# Patient Record
Sex: Male | Born: 1990 | Race: White | Hispanic: No | Marital: Single | Smoking: Current every day smoker
Health system: Southern US, Community
[De-identification: ages and names within clinical notes are randomized; demographics above are authoritative.]

## PROBLEM LIST (undated history)

## (undated) DIAGNOSIS — F1011 Alcohol abuse, in remission: Secondary | ICD-10-CM

## (undated) HISTORY — PX: HERNIA REPAIR: SHX51

---

## 2016-01-22 ENCOUNTER — Emergency Department (HOSPITAL_COMMUNITY): Payer: BLUE CROSS/BLUE SHIELD

## 2016-01-22 ENCOUNTER — Encounter (HOSPITAL_COMMUNITY): Payer: Self-pay

## 2016-01-22 ENCOUNTER — Emergency Department (HOSPITAL_COMMUNITY)
Admission: EM | Admit: 2016-01-22 | Discharge: 2016-01-22 | Disposition: A | Discharge status: Home / Self Care | Payer: BLUE CROSS/BLUE SHIELD | Attending: Emergency Medicine | Admitting: Emergency Medicine

## 2016-01-22 DIAGNOSIS — R531 Weakness: Secondary | ICD-10-CM | POA: Diagnosis present

## 2016-01-22 DIAGNOSIS — R2990 Unspecified symptoms and signs involving the nervous system: Secondary | ICD-10-CM | POA: Diagnosis not present

## 2016-01-22 DIAGNOSIS — R791 Abnormal coagulation profile: Secondary | ICD-10-CM | POA: Diagnosis not present

## 2016-01-22 DIAGNOSIS — F1010 Alcohol abuse, uncomplicated: Secondary | ICD-10-CM

## 2016-01-22 DIAGNOSIS — I639 Cerebral infarction, unspecified: Secondary | ICD-10-CM

## 2016-01-22 DIAGNOSIS — R200 Anesthesia of skin: Secondary | ICD-10-CM

## 2016-01-22 DIAGNOSIS — F1020 Alcohol dependence, uncomplicated: Secondary | ICD-10-CM | POA: Insufficient documentation

## 2016-01-22 DIAGNOSIS — R29898 Other symptoms and signs involving the musculoskeletal system: Secondary | ICD-10-CM

## 2016-01-22 HISTORY — DX: Alcohol abuse, in remission: F10.11

## 2016-01-22 LAB — URINALYSIS, ROUTINE W REFLEX MICROSCOPIC
BILIRUBIN URINE: NEGATIVE
Glucose, UA: NEGATIVE mg/dL
Hgb urine dipstick: NEGATIVE
KETONES UR: NEGATIVE mg/dL
LEUKOCYTES UA: NEGATIVE
NITRITE: NEGATIVE
PH: 7 (ref 5.0–8.0)
PROTEIN: NEGATIVE mg/dL
Specific Gravity, Urine: 1.025 (ref 1.005–1.030)

## 2016-01-22 LAB — I-STAT CHEM 8, ED
BUN: 4 mg/dL — AB (ref 6–20)
CALCIUM ION: 1.1 mmol/L — AB (ref 1.12–1.23)
CHLORIDE: 105 mmol/L (ref 101–111)
CREATININE: 0.7 mg/dL (ref 0.61–1.24)
Glucose, Bld: 86 mg/dL (ref 65–99)
HCT: 46 % (ref 39.0–52.0)
Hemoglobin: 15.6 g/dL (ref 13.0–17.0)
Potassium: 3.4 mmol/L — ABNORMAL LOW (ref 3.5–5.1)
Sodium: 140 mmol/L (ref 135–145)
TCO2: 21 mmol/L (ref 0–100)

## 2016-01-22 LAB — COMPREHENSIVE METABOLIC PANEL
ALBUMIN: 4.8 g/dL (ref 3.5–5.0)
ALK PHOS: 57 U/L (ref 38–126)
ALT: 51 U/L (ref 17–63)
ANION GAP: 10 (ref 5–15)
AST: 49 U/L — AB (ref 15–41)
BILIRUBIN TOTAL: 0.6 mg/dL (ref 0.3–1.2)
BUN: 7 mg/dL (ref 6–20)
CALCIUM: 9.3 mg/dL (ref 8.9–10.3)
CO2: 21 mmol/L — AB (ref 22–32)
CREATININE: 0.71 mg/dL (ref 0.61–1.24)
Chloride: 106 mmol/L (ref 101–111)
GFR calc Af Amer: >60 mL/min (ref >60)
GFR calc non Af Amer: >60 mL/min (ref >60)
GLUCOSE: 89 mg/dL (ref 65–99)
Potassium: 3.3 mmol/L — ABNORMAL LOW (ref 3.5–5.1)
SODIUM: 137 mmol/L (ref 135–145)
TOTAL PROTEIN: 7.7 g/dL (ref 6.5–8.1)

## 2016-01-22 LAB — RAPID URINE DRUG SCREEN, HOSP PERFORMED
AMPHETAMINES: NOT DETECTED
BARBITURATES: NOT DETECTED
Benzodiazepines: NOT DETECTED
Cocaine: NOT DETECTED
Opiates: NOT DETECTED
TETRAHYDROCANNABINOL: NOT DETECTED

## 2016-01-22 LAB — CBC
HCT: 44.1 % (ref 39.0–52.0)
HEMOGLOBIN: 15.8 g/dL (ref 13.0–17.0)
MCH: 34.9 pg — AB (ref 26.0–34.0)
MCHC: 35.8 g/dL (ref 30.0–36.0)
MCV: 97.4 fL (ref 78.0–100.0)
Platelets: 254 10*3/uL (ref 150–400)
RBC: 4.53 MIL/uL (ref 4.22–5.81)
RDW: 12 % (ref 11.5–15.5)
WBC: 5.3 10*3/uL (ref 4.0–10.5)

## 2016-01-22 LAB — DIFFERENTIAL
Basophils Absolute: 0 10*3/uL (ref 0.0–0.1)
Basophils Relative: 0 %
EOS PCT: 2 %
Eosinophils Absolute: 0.1 10*3/uL (ref 0.0–0.7)
LYMPHS ABS: 1.9 10*3/uL (ref 0.7–4.0)
LYMPHS PCT: 36 %
Monocytes Absolute: 0.6 10*3/uL (ref 0.1–1.0)
Monocytes Relative: 11 %
NEUTROS ABS: 2.7 10*3/uL (ref 1.7–7.7)
NEUTROS PCT: 51 %

## 2016-01-22 LAB — PROTIME-INR
INR: 1.11 (ref 0.00–1.49)
Prothrombin Time: 14.1 seconds (ref 11.6–15.2)

## 2016-01-22 LAB — I-STAT TROPONIN, ED: Troponin i, poc: 0 ng/mL (ref 0.00–0.08)

## 2016-01-22 LAB — APTT: aPTT: 29 seconds (ref 24–37)

## 2016-01-22 LAB — ETHANOL: Alcohol, Ethyl (B): 25 mg/dL — ABNORMAL HIGH (ref <5)

## 2016-01-22 MED ORDER — IOPAMIDOL (ISOVUE-370) INJECTION 76%
100.0000 mL | Freq: Once | INTRAVENOUS | Status: AC | PRN
  Administered 2016-01-22: 100 mL via INTRAVENOUS

## 2016-01-22 MED ORDER — ASPIRIN EC 325 MG PO TBEC
325.0000 mg | DELAYED_RELEASE_TABLET | Freq: Every day | ORAL | Status: DC
  Administered 2016-01-22: 325 mg via ORAL
  Filled 2016-01-22: qty 1

## 2016-01-22 MED ORDER — GADOBENATE DIMEGLUMINE 529 MG/ML IV SOLN
20.0000 mL | Freq: Once | INTRAVENOUS | Status: AC | PRN
  Administered 2016-01-22: 16 mL via INTRAVENOUS

## 2016-01-22 MED ORDER — CHLORDIAZEPOXIDE HCL 25 MG PO CAPS: ORAL_CAPSULE | ORAL | Status: DC

## 2016-01-22 MED ORDER — SODIUM CHLORIDE 0.9 % IV BOLUS (SEPSIS)
500.0000 mL | Freq: Once | INTRAVENOUS | Status: AC
  Administered 2016-01-22: 500 mL via INTRAVENOUS

## 2016-01-22 NOTE — ED Notes (Addendum)
Patient transported to MRI. RN WITH PT. CHARGE ALAINA RN AWARE

## 2016-01-22 NOTE — Consult Note (Signed)
Requesting Physician: Dr. Cyndie Chime     Reason for consultation:  To evaluate for acute stroke  HPI:                                                                                                                                         Timothy Holland is an 25 y.o. male patient with ongoing alcohol abuse history, chronic migraines, workup this morning with symptoms of left face and left arm numbness and paresthesias. He presented to the emergency room for further evaluation. His mother was at bedside. Patient apparently lives alone continues to drink alcohol daily. His mother reports that his alcohol abuse is mainly due to insomnia.he denies any other new vision or speech problems or any motor weakness in limbs .   Past Medical History: Past Medical History  Diagnosis Date  . History of ETOH abuse     Past Surgical History  Procedure Laterality Date  . Hernia repair      Family History: No family history on file.  Social History:   reports that he has been smoking.  He does not have any smokeless tobacco history on file. He reports that he drinks alcohol. His drug history is not on file.  Allergies:  No Known Allergies   Medications:                                                                                                                         Current facility-administered medications:  .  aspirin EC tablet 325 mg, 325 mg, Oral, Daily, Andreah Goheen Daniel Nones, MD No current outpatient prescriptions on file.   ROS:  History obtained from the patient  General ROS: negative for - chills, fatigue, fever, night sweats, weight gain or weight loss Psychological ROS: negative for - behavioral disorder, hallucinations, memory difficulties, mood swings or suicidal ideation Ophthalmic ROS: negative for - blurry vision, double vision, eye pain or  loss of vision ENT ROS: negative for - epistaxis, nasal discharge, oral lesions, sore throat, tinnitus or vertigo Allergy and Immunology ROS: negative for - hives or itchy/watery eyes Hematological and Lymphatic ROS: negative for - bleeding problems, bruising or swollen lymph nodes Endocrine ROS: negative for - galactorrhea, hair pattern changes, polydipsia/polyuria or temperature intolerance Respiratory ROS: negative for - cough, hemoptysis, shortness of breath or wheezing Cardiovascular ROS: negative for - chest pain, dyspnea on exertion, edema or irregular heartbeat Gastrointestinal ROS: negative for - abdominal pain, diarrhea, hematemesis, nausea/vomiting or stool incontinence Genito-Urinary ROS: negative for - dysuria, hematuria, incontinence or urinary frequency/urgency Musculoskeletal ROS: negative for - joint swelling or muscular weakness Neurological ROS: as noted in HPI Dermatological ROS: negative for rash and skin lesion changes  Neurologic Examination:                                                                                                    Today's Vitals   01/22/16 1004 01/22/16 1010 01/22/16 1011 01/22/16 1100  BP:  141/76  132/76  Pulse:  76  74  Temp:  98.3 F (36.8 C)    TempSrc:  Oral    Resp:  18  13  Height:  5\' 10"  (1.778 m)    Weight:  79.833 kg (176 lb)    SpO2: 100% 100%  100%  PainSc:   0-No pain     Evaluation of higher integrative functions including: Level of alertness: Alert,  Oriented to time, place and person Recent and remote memory - intact   Attention span and concentration  - intact   Speech: fluent, no evidence of dysarthria or aphasia noted.  Test the following cranial nerves: 2-12 grossly intact Motor examination: Normal tone, bulk, full 5/5 motor strength in all 4 extremities Examination of sensation : subjective reduced sensation in the left face and left arm , otherwise normal elsewhere  Examination of deep tendon reflexes: 2+,  normal and symmetric in all extremities, normal plantars bilaterally Test coordination: Normal finger nose testing, with no evidence of limb appendicular ataxia or abnormal involuntary movements or tremors noted.  Gait: normal      Lab Results: Basic Metabolic Panel:  Recent Labs Lab 01/22/16 1022 01/22/16 1031  NA 137 140  K 3.3* 3.4*  CL 106 105  CO2 21*  --   GLUCOSE 89 86  BUN 7 4*  CREATININE 0.71 0.70  CALCIUM 9.3  --     Liver Function Tests:  Recent Labs Lab 01/22/16 1022  AST 49*  ALT 51  ALKPHOS 57  BILITOT 0.6  PROT 7.7  ALBUMIN 4.8   No results for input(s): LIPASE, AMYLASE in the last 168 hours. No results for input(s): AMMONIA in the last 168 hours.  CBC:  Recent Labs Lab 01/22/16 1022 01/22/16  1031  WBC 5.3  --   NEUTROABS 2.7  --   HGB 15.8 15.6  HCT 44.1 46.0  MCV 97.4  --   PLT 254  --     Cardiac Enzymes: No results for input(s): CKTOTAL, CKMB, CKMBINDEX, TROPONINI in the last 168 hours.  Lipid Panel: No results for input(s): CHOL, TRIG, HDL, CHOLHDL, VLDL, LDLCALC in the last 168 hours.  CBG: No results for input(s): GLUCAP in the last 168 hours.  Microbiology: No results found for this or any previous visit.   Imaging: No results found.      Assessment and plan:   Timothy Holland is an 25 y.o. male patient with ongoing alcohol abuse history, chronic migraines, workup this morning with symptoms of left face and left arm numbness and paresthesias. He presented to the emergency room for further evaluation. His mother was at bedside. Patient apparently lives alone continues to drink alcohol daily. His mother reports that his alcohol abuse is mainly due to insomnia.he denies any other new vision or speech problems or any motor weakness in limbs . Grossly nonfocal exam except for some subjective reduced sensation in the left face and left hand.  At this point, I recommend MRI of brain And MRI of the cervical spineto rule out  acute pathology contributing to these symptoms.  Both of these studies were done in the ER, and were negative for any pathology. Reviewed the images with both patient and his mother, reassured them. No further neurodiagnostic testing or treatment recommended at this point. Advised to follow-up with outpatient neurology if symptoms persist. Counseled about alcohol cessation.   Discussed with ER physician.

## 2016-01-22 NOTE — Progress Notes (Signed)
Patient listed as having [employer] insurance without a pcp.  EDCM spoke to patient at bedside.  Patient confirms he does not have a pcp.  Patient's mother at bedside.  Patient's [place].  EDCM offered list of pcps in [place], but patient refused.  He reports, "I don't really like going to doctors anyway."  EDCM explained the proper use of the ED vs pcp.  EDCM  Discussed importance and purpose of pcp.  [facility] as well.  Patient's mother asked, "Does he need to see a neurologist or just if this happens again?"  EDCM deferred question to EDRN who will speak to patient and family.  No further EDCM needs at this time.

## 2016-01-22 NOTE — ED Notes (Signed)
Neuro present

## 2016-01-22 NOTE — Discharge Instructions (Signed)
You were seen and evaluated today for your left arm weakness. The exact cause is not clear at this time. Please follow-up with neurology outpatient. You have been provided with a prescription of medication to help you detox from alcohol. You've also been given resources that he should follow-up with in the community to help with this issue. Addiction is a strong disease and this will require lots of help and support.  Alcohol Abuse and Nutrition Alcohol abuse is any pattern of alcohol consumption that harms your health, relationships, or work. Alcohol abuse can affect how your body breaks down and absorbs nutrients from food by causing your liver to work abnormally. Additionally, many people who abuse alcohol do not eat enough carbohydrates, protein, fat, vitamins, and minerals. This can cause poor nutrition (malnutrition) and a lack of nutrients (nutrient deficiencies), which can lead to further complications. Nutrients that are commonly lacking (deficient) among people who abuse alcohol include:  Vitamins.  Vitamin A. This is stored in your liver. It is important for your vision, metabolism, and ability to fight off infections (immunity).  B vitamins. These include vitamins such as folate, thiamin, and niacin. These are important in new cell growth and maintenance.  Vitamin C. This plays an important role in iron absorption, wound healing, and immunity.  Vitamin D. This is produced by your liver, but you can also get vitamin D from food. Vitamin D is necessary for your body to absorb and use calcium.  Minerals.  Calcium. This is important for your bones and your heart and blood vessel (cardiovascular) function.  Iron. This is important for blood, muscle, and nervous system functioning.  Magnesium. This plays an important role in muscle and nerve function, and it helps to control blood sugar and blood pressure.  Zinc. This is important for the normal function of your nervous system and  digestive system (gastrointestinal tract). Nutrition is an essential component of therapy for alcohol abuse. Your health care provider or dietitian will work with you to design a plan that can help restore nutrients to your body and prevent potential complications. WHAT IS MY PLAN? Your dietitian may develop a specific diet plan that is based on your condition and any other complications you may have. A diet plan will commonly include:  A balanced diet.  Grains: 6-8 oz per day.  Vegetables: 2-3 cups per day.  Fruits: 1-2 cups per day.  Meat and other protein: 5-6 oz per day.  Dairy: 2-3 cups per day.  Vitamin and mineral supplements. WHAT DO I NEED TO KNOW ABOUT ALCOHOL AND NUTRITION?  Consume foods that are high in antioxidants, such as grapes, berries, nuts, green tea, and dark green and orange vegetables. This can help to counteract some of the stress that is placed on your liver by consuming alcohol.  Avoid food and drinks that are high in fat and sugar. Foods such as sugared soft drinks, salty snack foods, and candy contain empty calories. This means that they lack important nutrients such as protein, fiber, and vitamins.  Eat frequent meals and snacks. Try to eat 5-6 small meals each day.  Eat a variety of fresh fruits and vegetables each day. This will help you get plenty of water, fiber, and vitamins in your diet.  Drink plenty of water and other clear fluids. Try to drink at least 48-64 oz (1.5-2 L) of water per day.  If you are a vegetarian, eat a variety of protein-rich foods. Pair whole grains with plant-based proteins at meals  and snacks to obtain the greatest nutrient benefit from your food. For example, eat rice with beans, put peanut butter on whole-grain toast, or eat oatmeal with sunflower seeds.  Soak beans and whole grains overnight before cooking. This can help your body to absorb the nutrients more easily.  Include foods fortified with vitamins and minerals in  your diet. Commonly fortified foods include milk, orange juice, cereal, and bread.  If you are malnourished, your dietitian may recommend a high-protein, high-calorie diet. This may include:  2,000-3,000 calories (kilocalories) per day.  70-100 grams of protein per day.  Your health care provider may recommend a complete nutritional supplement beverage. This can help to restore calories, protein, and vitamins to your body. Depending on your condition, you may be advised to consume this instead of or in addition to meals.  Limit your intake of caffeine. Replace drinks like coffee and black tea with decaffeinated coffee and herbal tea.  Eat a variety of foods that are high in omega fatty acids. These include fish, nuts and seeds, and soybeans. These foods may help your liver to recover and may also stabilize your mood.  Certain medicines may cause changes in your appetite, taste, and weight. Work with your health care provider and dietitian to make any adjustments to your medicines and diet plan.  Include other healthy lifestyle choices in your daily routine.  Be physically active.  Get enough sleep.  Spend time doing activities that you enjoy.  If you are unable to take in enough food and calories by mouth, your health care provider may recommend a feeding tube. This is a tube that passes through your nose and throat, directly into your stomach. Nutritional supplement beverages can be given to you through the feeding tube to help you get the nutrients you need.  Take vitamin or mineral supplements as recommended by your health care provider. WHAT FOODS CAN I EAT? Grains Enriched pasta. Enriched rice. Fortified whole-grain bread. Fortified whole-grain cereal. Barley. Brown rice. Quinoa. Millet. Vegetables All fresh, frozen, and canned vegetables. Spinach. Kale. Artichoke. Carrots. Winter squash and pumpkin. Sweet potatoes. Broccoli. Cabbage. Cucumbers. Tomatoes. Sweet peppers. Green  beans. Peas. Corn. Fruits All fresh and frozen fruits. Berries. Grapes. Mango. Papaya. Guava. Cherries. Apples. Bananas. Peaches. Plums. Pineapple. Watermelon. Cantaloupe. Oranges. Avocado. Meats and Other Protein Sources Beef liver. Lean beef. Pork. Fresh and canned chicken. Fresh fish. Oysters. Sardines. Canned tuna. Shrimp. Eggs with yolks. Nuts and seeds. Peanut butter. Beans and lentils. Soybeans. Tofu. Dairy Whole, low-fat, and nonfat milk. Whole, low-fat, and nonfat yogurt. Cottage cheese. Sour cream. Hard and soft cheeses. Beverages Water. Herbal tea. Decaffeinated coffee. Decaffeinated green tea. 100% fruit juice. 100% vegetable juice. Instant breakfast shakes. Condiments Ketchup. Mayonnaise. Mustard. Salad dressing. Barbecue sauce. Sweets and Desserts Sugar-free ice cream. Sugar-free pudding. Sugar-free gelatin. Fats and Oils Butter. Vegetable oil, flaxseed oil, olive oil, and walnut oil. Other Complete nutrition shakes. Protein bars. Sugar-free gum. The items listed above may not be a complete list of recommended foods or beverages. Contact your dietitian for more options. WHAT FOODS ARE NOT RECOMMENDED? Grains Sugar-sweetened breakfast cereals. Flavored instant oatmeal. Fried breads. Vegetables Breaded or deep-fried vegetables. Fruits Dried fruit with added sugar. Candied fruit. Canned fruit in syrup. Meats and Other Protein Sources Breaded or deep-fried meats. Dairy Flavored milks. Fried cheese curds or fried cheese sticks. Beverages Alcohol. Sugar-sweetened soft drinks. Sugar-sweetened tea. Caffeinated coffee and tea. Condiments Sugar. Honey. Agave nectar. Molasses. Sweets and Desserts Chocolate. Cake. Cookies. Candy. Other Potato  chips. Pretzels. Salted nuts. Candied nuts. The items listed above may not be a complete list of foods and beverages to avoid. Contact your dietitian for more information.   This information is not intended to replace advice given to you  by your health care provider. Make sure you discuss any questions you have with your health care provider.   Document Released: 06/03/2005 Document Revised: 08/30/2014 Document Reviewed: 03/12/2014 Elsevier Interactive Patient Education 2016 Elsevier Inc.   Paresthesia Paresthesia is an abnormal burning or prickling sensation. This sensation is generally felt in the hands, arms, legs, or feet. However, it may occur in any part of the body. Usually, it is not painful. The feeling may be described as:  Tingling or numbness.  Pins and needles.  Skin crawling.  Buzzing.  Limbs falling asleep.  Itching. Most people experience temporary (transient) paresthesia at some time in their lives. Paresthesia may occur when you breathe too quickly (hyperventilation). It can also occur without any apparent cause. Commonly, paresthesia occurs when pressure is placed on a nerve. The sensation quickly goes away after the pressure is removed. For some people, however, paresthesia is a long-lasting (chronic) condition that is caused by an underlying disorder. If you continue to have paresthesia, you may need further medical evaluation. HOME CARE INSTRUCTIONS Watch your condition for any changes. Taking the following actions may help to lessen any discomfort that you are feeling:  Avoid drinking alcohol.  Try acupuncture or massage to help relieve your symptoms.  Keep all follow-up visits as directed by your health care provider. This is important. SEEK MEDICAL CARE IF:  You continue to have episodes of paresthesia.  Your burning or prickling feeling gets worse when you walk.  You have pain, cramps, or dizziness.  You develop a rash. SEEK IMMEDIATE MEDICAL CARE IF:  You feel weak.  You have trouble walking or moving.  You have problems with speech, understanding, or vision.  You feel confused.  You cannot control your bladder or bowel movements.  You have numbness after an injury.  You  faint.   This information is not intended to replace advice given to you by your health care provider. Make sure you discuss any questions you have with your health care provider.   Document Released: 07/30/2002 Document Revised: 12/24/2014 Document Reviewed: 08/05/2014 Elsevier Interactive Patient Education 2016 [employer].  [employer] Guide Outpatient Counseling/Substance Abuse Adolescent The [employer] 211 is a great source of information about community services available.  Access by dialing 2-1-1 from anywhere in [place], or by website -  PooledIncome.pl.   Other Local Resources (Updated 08/2015)  Crisis Hotlines   Services     Area Served  [employer]  Crisis Hotline, available 24 hours a day, 7 days a week: 636-146-6928 [facility], [place]   [facility]  Crisis Hotline, available 24 hours a day, 7 days a week: 214-601-2853 [facility], [place]  [facility]  Suicide Prevention Hotline, available 24 hours a day, 7 days a week: [phone] [facility], [place]  [employer], available 24 hours a day, 7 days a week: 902-023-6431 [facility], [place]   [facility] Access to [employer], available 24 hours a day, 7 days a week: 6474398986 All   [facility]  Crisis Hotline, available 24 hours a day, 7 days a week: (563) 600-3820 All   Other Local Resources (Updated 08/2015)  Outpatient Counseling/ Substance Abuse Programs  Services  Address and Phone Number  [facility]  Offers individual counseling [phone] [address], [address] [place], [place] [address]  [facility]  Offers individual counseling  Accepts Medicare, private pay, and private insurance [phone] [address], [address] [place], [place] [address]  [facility]  Provides individual counseling, substance abuse intensive  outpatient program (several hours a day, several days a week), day treatment program, and school-based therapy  Delene Lollccepts Medicare, Medicaid, private insurance [phone] [address], [address] [place], [place] [address]  [facility]  Offers individual counseling, family counseling, group therapy, substance abuse intensive outpatient program (several hours a day, several days a week), and a partial hospitalization program [phone] [address] [place], [place] [address]  [phone] [address] [place], [place] [address]  [phone] [address] [place], [place] [address]  [phone] [address] [place] [address] [place], [place] [address]  [facility]  Offers individual and family counseling  Accepts Medicaid and private insurance  Offers a sliding scale for uninsured [phone] [address], [address] [place], [place] [address]  [facility]  Accepts private insurance [phone] [address], [address] [place], [place] [address]  [facility] [employer]  Offers individual counseling and intensive in-home services [phone] [address], [address] [place], [place] [address]  [facility]  Offers individual counseling, family counseling, group therapy, domestic violence counseling  Accepts Medicaid and private insurance  Offers sliding scale for uninsured [phone] [address] [place], [place] [address]  [phone] [facility], [address] [place], [place] [address]  [facility]  Offers individual counseling, family counseling group therapy, and school-based therapy  3 locations - [place], [place], and [place] [phone]  [address] [place], [place] [address]  [address] [place], [place] [address]  [address] [place], [place] [address]  [facility]  Offers individual and family  counseling  Accepts [place] and private insurance  Offers sliding scale for uninsured [phone] [address] [place], [place] [address]  Len Blalockavid Fuller, MD  Accepts private insurance [phone] [address] [place], [place] [address]  [facility]   Offers outpatient substance abuse counseling, intensive outpatient substance abuse programs (several hours a day, several days a week), and residential substance abuse treatment  [phone], or [phone] [address], [address] [place], [place]  [facility]  Accepts private insurance [phone] [address] [place], [place] [address]  [facility]  Accepts Medicare and private insurance [phone] [address] [place], [place] [address]  [facility]    Offers intensive outpatient program (several hours a day, several days a week)  Accepts private pay and private insurance [phone] [facility] [address] [place], [place]  [facility]    Offers intensive outpatient program (several hours a day, several days a week), and partial hospitalization program [phone] [address] [place], [place] [address]  [facility]  Christian counseling  Offers individual and family counseling  Accepts private insurance  Offers sliding scale for uninsured [phone] [address] [place], [place] [address]  [facility] [phone] [address], [address] [place], [place] [address]  [facility]  Offers individual and family counseling  Offers LGBTQ services  Accepts private insurance and private pay [phone] [address] [place], [place] [address]  [facility]  Offers individual and family counseling  Accepts private insurance [phone] [address] [place], [place] [address]  [facility]   Adolescent  Substance Abuse Program (ASAP): [phone]  [employer] Structured Day Program: [phone] [place], [place]  [facility]  Serves children ages 54 - 63 and their families  Offers intensive in-home treatment and residential programs [phone] [address], [address] [place], [place] [address]

## 2016-01-22 NOTE — ED Notes (Signed)
CODE STROKE CALLED

## 2016-01-22 NOTE — Progress Notes (Signed)
CSW was notified by EDP that pt is interested in resources regarding alcohol treatment.   CSW met with patient at bedside. Patient was alert and oriented. Family was present. Patient confirms that he is interested in information for substance abuse. Patient states that he is interested in outpatient centers. CSW provided pt with a list of treatment centers. Patient accepted. Patient states that he has no questions at this time.  Willette Brace [phone] ED CSW 01/22/2016 4:17 PM

## 2016-01-22 NOTE — ED Notes (Signed)
MD at bedside. 

## 2016-01-22 NOTE — ED Provider Notes (Signed)
CSN: [id number]     Arrival date & time 01/22/16  7829 History   First MD Initiated Contact with Patient 01/22/16 1001     Chief Complaint  Patient presents with  . Extremity Weakness     (Consider location/radiation/quality/duration/timing/severity/associated sxs/prior Treatment) HPI Comments: 25 year old male with history of migraines presents for left arm weakness and left face numbness. The patient states that over the last few weeks he has had 2-3 migraines a week. He does not follow with a specialist for this because he drinks alcohol heavily and says he knows he can't take medications while drinking alcohol. He states this morning he woke up feeling fine but lives in the bathroom at 720 this morning he developed left arm numbness and weakness. He continued to go to work but the symptoms got worse and he also developed left facial numbness as well as numb numbness in his left tongue. Denies any change in speech. Reports normal sensation and strength of his lower extremities. Denies headache. No visual changes. He reports that his grandmother recently had a stroke. Denies trauma. No neck pain or previous neck injuries. Patient reports daily heavy alcohol abuse.  Patient is a 25 y.o. male presenting with extremity weakness.  Extremity Weakness Pertinent negatives include no chest pain, no abdominal pain, no headaches and no shortness of breath.    Past Medical History  Diagnosis Date  . History of ETOH abuse    Past Surgical History  Procedure Laterality Date  . Hernia repair     No family history on file. Social History  Substance Use Topics  . Smoking status: Current Every Day Smoker  . Smokeless tobacco: None  . Alcohol Use: Yes     Comment: 12 pack daily and 1 pint daily    Review of Systems  Constitutional: Negative for fever, chills, appetite change and fatigue.  HENT: Negative for congestion and postnasal drip.   Eyes: Negative for visual disturbance.  Respiratory:  Negative for cough, chest tightness and shortness of breath.   Cardiovascular: Negative for chest pain and palpitations.  Gastrointestinal: Negative for nausea, vomiting, abdominal pain and diarrhea.  Genitourinary: Negative for dysuria, urgency and hematuria.  Musculoskeletal: Positive for extremity weakness. Negative for myalgias and back pain.  Skin: Negative for rash.  Neurological: Positive for weakness (left arm) and numbness (left arm, left side of face and tongue). Negative for dizziness, light-headedness and headaches.  Hematological: Does not bruise/bleed easily.      Allergies  Review of patient's allergies indicates no known allergies.  Home Medications   Prior to Admission medications   Medication Sig Start Date End Date Taking? Authorizing Provider  acetaminophen (TYLENOL) 500 MG tablet Take 3,000 mg by mouth daily as needed (migraine).   Yes Historical Provider, MD  chlordiazePOXIDE (LIBRIUM) 25 MG capsule 50mg  PO TID x 1D, then 25-50mg  PO BID X 1D, then 25-50mg  PO QD X 1D 01/22/16   Leta Baptist, MD   BP 130/78 mmHg  Pulse 60  Temp(Src) 98.4 F (36.9 C) (Oral)  Resp 17  Ht 5\' 10"  (1.778 m)  Wt 176 lb (79.833 kg)  BMI 25.25 kg/m2  SpO2 99% Physical Exam  Constitutional: He is oriented to person, place, and time. He appears well-developed and well-nourished. No distress.  HENT:  Head: Normocephalic and atraumatic.  Right Ear: External ear normal.  Left Ear: External ear normal.  Mouth/Throat: Oropharynx is clear and moist. No oropharyngeal exudate.  Eyes: EOM are normal. Pupils are equal, round, and  reactive to light.  Full visual fields  Neck: Normal range of motion. Neck supple.  Cardiovascular: Normal rate, regular rhythm, normal heart sounds and intact distal pulses.   No murmur heard. Pulmonary/Chest: Effort normal. No respiratory distress. He has no wheezes. He has no rales.  Abdominal: Soft. He exhibits no distension. There is no tenderness.   Musculoskeletal: He exhibits no edema.  Neurological: He is alert and oriented to person, place, and time.  Patient with decreased strength in the left arm with mild arm drift, decreased grip strength.  Normal strength in other extremities.  Mild decreased sensation in the left lower face with normal grimace and without facial asymmetry.  Normal tone.  Normal speech.  NIHSS 3.  Skin: Skin is warm and dry. No rash noted. He is not diaphoretic.  Vitals reviewed.   ED Course  Procedures (including critical care time) Labs Review Labs Reviewed  ETHANOL - Abnormal; Notable for the following:    Alcohol, Ethyl (B) 25 (*)    All other components within normal limits  CBC - Abnormal; Notable for the following:    MCH 34.9 (*)    All other components within normal limits  COMPREHENSIVE METABOLIC PANEL - Abnormal; Notable for the following:    Potassium 3.3 (*)    CO2 21 (*)    AST 49 (*)    All other components within normal limits  I-STAT CHEM 8, ED - Abnormal; Notable for the following:    Potassium 3.4 (*)    BUN 4 (*)    Calcium, Ion 1.10 (*)    All other components within normal limits  PROTIME-INR  APTT  DIFFERENTIAL  URINE RAPID DRUG SCREEN, HOSP PERFORMED  URINALYSIS, ROUTINE W REFLEX MICROSCOPIC (NOT AT [facility])  I-STAT TROPOININ, ED    Imaging Review Ct Angio Head W/cm &/or Wo Cm  01/22/2016  ADDENDUM REPORT: 01/22/2016 11:47 ADDENDUM: Study discussed by telephone with Dr. Patsey Berthold on 01/22/2016 At 1123 hours. Electronically Signed   By: Odessa Fleming M.D.   On: 01/22/2016 11:47  01/22/2016  CLINICAL DATA:  25 year old male code stroke, presenting with left arm numbness and weakness since 0730 hours. Initial encounter. EXAM: CT ANGIOGRAPHY HEAD AND NECK TECHNIQUE: Multidetector CT imaging of the head and neck was performed using the standard protocol during bolus administration of intravenous contrast. Multiplanar CT image reconstructions and MIPs were obtained to evaluate the  vascular anatomy. Carotid stenosis measurements (when applicable) are obtained utilizing NASCET criteria, using the distal internal carotid diameter as the denominator. CONTRAST:  100 mL Isovue 370 COMPARISON:  None. FINDINGS: CT HEAD Brain: Cerebral volume is normal. No midline shift, mass effect, or evidence of intracranial mass lesion. No acute intracranial hemorrhage identified. *INSERT* infarcts; subtle hypodensity at the left superior peri-Rolandic cortex seen on series 15, image 26 is felt to be partial volume artifact. Normal gray-white matter differentiation elsewhere. Calvarium and skull base: Negative. Paranasal sinuses: Clear. Orbits: Negative. CTA NECK Skeleton: Motion artifact through the C4 level of the neck affecting the mandible and cervical spine detail. Mild scoliotic curvature of the upper thoracic spine. No acute osseous abnormality identified. Minimal left maxillary sinus mucosal thickening. Other neck: Negative lung apices. No superior mediastinal lymphadenopathy. Negative thyroid, larynx, pharynx (motion artifact), parapharyngeal spaces, retropharyngeal space, sublingual space, submandibular glands (motion artifact) and parotid glands. No cervical lymphadenopathy. Aortic arch: 3 vessel arch configuration. Normal arch and great vessel origins. Right carotid system: Mild obscuration of the right CCA origin due to dense right  subclavian contrast artifact. Negative right CCA otherwise. Motion artifact degrades detail of the right carotid bifurcation which is patent. No cervical right ICA abnormality identified, mild tortuosity. Left carotid system: Negative left CCA. Motion artifact obscures detail of the left carotid bifurcation which is patent. Negative cervical left ICA otherwise. Vertebral arteries: No proximal subclavian artery stenosis. Normal vertebral artery origins. Codominant vertebral artery is are obscured in the V2 segment at C4 due to motion, but otherwise appear normal to the  skullbase. By the level of the skullbase the left vertebral artery is dominant. CTA HEAD Posterior circulation: No distal vertebral artery stenosis, the left is dominant. Both PICA origins are normal. Normal vertebrobasilar junction and basilar artery. Normal SCA and left PCA origins. Fetal type right PCA origin. Left posterior communicating artery is present. Bilateral PCA branches are within normal limits. Anterior circulation: Both ICA siphons are normal. Normal ophthalmic and posterior communicating artery origins. Normal left ICA terminus. The right is hypoplastic owing to diminutive or absent right ACA A1 segment. Normal left ACA and MCA origins. Anterior communicating artery may be fenestrated but otherwise is normal. bilateral ACA branches are normal. Left MCA M1 segment, bifurcation, and left MCA branches are normal. Right MCA M1 segment, bifurcation, and right MCA branches are normal. Venous sinuses: Patent. Anatomic variants: Diminutive or absent right ACA A1 segment. Fenestrated anterior communicating artery. Mildly dominant distal left vertebral artery. Delayed phase: No abnormal intracranial enhancement. There may be a small developmental venous anomaly in the right parietal lobe (series 17, image 22). IMPRESSION: 1. Motion artifact at the C4 level of the neck obscuring detail of the bilateral carotid bifurcations and vertebral artery V2 segments, but negative for emergent large vessel occlusion. 2. No arterial abnormality identified. 3.  Normal CT appearance of the brain. Electronically Signed: By: Odessa Fleming M.D. On: 01/22/2016 11:15   Ct Angio Neck W/cm &/or Wo/cm  01/22/2016  ADDENDUM REPORT: 01/22/2016 11:47 ADDENDUM: Study discussed by telephone with Dr. Patsey Berthold on 01/22/2016 At 1123 hours. Electronically Signed   By: Odessa Fleming M.D.   On: 01/22/2016 11:47  01/22/2016  CLINICAL DATA:  25 year old male code stroke, presenting with left arm numbness and weakness since 0730 hours. Initial encounter.  EXAM: CT ANGIOGRAPHY HEAD AND NECK TECHNIQUE: Multidetector CT imaging of the head and neck was performed using the standard protocol during bolus administration of intravenous contrast. Multiplanar CT image reconstructions and MIPs were obtained to evaluate the vascular anatomy. Carotid stenosis measurements (when applicable) are obtained utilizing NASCET criteria, using the distal internal carotid diameter as the denominator. CONTRAST:  100 mL Isovue 370 COMPARISON:  None. FINDINGS: CT HEAD Brain: Cerebral volume is normal. No midline shift, mass effect, or evidence of intracranial mass lesion. No acute intracranial hemorrhage identified. *INSERT* infarcts; subtle hypodensity at the left superior peri-Rolandic cortex seen on series 15, image 26 is felt to be partial volume artifact. Normal gray-white matter differentiation elsewhere. Calvarium and skull base: Negative. Paranasal sinuses: Clear. Orbits: Negative. CTA NECK Skeleton: Motion artifact through the C4 level of the neck affecting the mandible and cervical spine detail. Mild scoliotic curvature of the upper thoracic spine. No acute osseous abnormality identified. Minimal left maxillary sinus mucosal thickening. Other neck: Negative lung apices. No superior mediastinal lymphadenopathy. Negative thyroid, larynx, pharynx (motion artifact), parapharyngeal spaces, retropharyngeal space, sublingual space, submandibular glands (motion artifact) and parotid glands. No cervical lymphadenopathy. Aortic arch: 3 vessel arch configuration. Normal arch and great vessel origins. Right carotid system: Mild obscuration of the  right CCA origin due to dense right subclavian contrast artifact. Negative right CCA otherwise. Motion artifact degrades detail of the right carotid bifurcation which is patent. No cervical right ICA abnormality identified, mild tortuosity. Left carotid system: Negative left CCA. Motion artifact obscures detail of the left carotid bifurcation which is  patent. Negative cervical left ICA otherwise. Vertebral arteries: No proximal subclavian artery stenosis. Normal vertebral artery origins. Codominant vertebral artery is are obscured in the V2 segment at C4 due to motion, but otherwise appear normal to the skullbase. By the level of the skullbase the left vertebral artery is dominant. CTA HEAD Posterior circulation: No distal vertebral artery stenosis, the left is dominant. Both PICA origins are normal. Normal vertebrobasilar junction and basilar artery. Normal SCA and left PCA origins. Fetal type right PCA origin. Left posterior communicating artery is present. Bilateral PCA branches are within normal limits. Anterior circulation: Both ICA siphons are normal. Normal ophthalmic and posterior communicating artery origins. Normal left ICA terminus. The right is hypoplastic owing to diminutive or absent right ACA A1 segment. Normal left ACA and MCA origins. Anterior communicating artery may be fenestrated but otherwise is normal. bilateral ACA branches are normal. Left MCA M1 segment, bifurcation, and left MCA branches are normal. Right MCA M1 segment, bifurcation, and right MCA branches are normal. Venous sinuses: Patent. Anatomic variants: Diminutive or absent right ACA A1 segment. Fenestrated anterior communicating artery. Mildly dominant distal left vertebral artery. Delayed phase: No abnormal intracranial enhancement. There may be a small developmental venous anomaly in the right parietal lobe (series 17, image 22). IMPRESSION: 1. Motion artifact at the C4 level of the neck obscuring detail of the bilateral carotid bifurcations and vertebral artery V2 segments, but negative for emergent large vessel occlusion. 2. No arterial abnormality identified. 3.  Normal CT appearance of the brain. Electronically Signed: By: Odessa Fleming M.D. On: 01/22/2016 11:15   Mr Laqueta Jean UJ Contrast  01/22/2016  CLINICAL DATA:  LEFT face numbness. History of alcohol use. LEFT arm weakness.  EXAM: MRI HEAD WITHOUT AND WITH CONTRAST MRI CERVICAL SPINE WITHOUT AND WITH CONTRAST TECHNIQUE: Multiplanar, multiecho pulse sequences of the brain and surrounding structures, and cervical spine, to include the craniocervical junction and cervicothoracic junction, were obtained without and with intravenous contrast. CONTRAST:  16mL MULTIHANCE GADOBENATE DIMEGLUMINE 529 MG/ML IV SOLN COMPARISON:  None. FINDINGS: MRI HEAD FINDINGS No evidence for acute infarction, hemorrhage, mass lesion, hydrocephalus, or extra-axial fluid. Normal cerebral volume. No white matter disease. Pituitary, pineal, and cerebellar tonsils unremarkable. No upper cervical lesions. Flow voids are maintained throughout the carotid, basilar, and vertebral arteries. There are no areas of chronic hemorrhage. Post infusion, no abnormal enhancement of the brain or meninges. Visualized calvarium, skull base, and upper cervical osseous structures unremarkable. Scalp and extracranial soft tissues, orbits, sinuses, and mastoids show no acute process. MRI CERVICAL SPINE FINDINGS There is no evidence for disc degeneration, disc herniation, vertebral body abnormality, or paraspinous mass. Normal cord size and signal throughout. Normal vertebral arteries. Post infusion, no abnormal enhancement of the intraspinal or surrounding soft tissues. IMPRESSION: Negative MRI brain and cervical spine. Electronically Signed   By: Elsie Stain M.D.   On: 01/22/2016 14:46   Mr Cervical Spine W Wo Contrast  01/22/2016  CLINICAL DATA:  LEFT face numbness. History of alcohol use. LEFT arm weakness. EXAM: MRI HEAD WITHOUT AND WITH CONTRAST MRI CERVICAL SPINE WITHOUT AND WITH CONTRAST TECHNIQUE: Multiplanar, multiecho pulse sequences of the brain and surrounding structures, and cervical spine,  to include the craniocervical junction and cervicothoracic junction, were obtained without and with intravenous contrast. CONTRAST:  16mL MULTIHANCE GADOBENATE DIMEGLUMINE 529 MG/ML  IV SOLN COMPARISON:  None. FINDINGS: MRI HEAD FINDINGS No evidence for acute infarction, hemorrhage, mass lesion, hydrocephalus, or extra-axial fluid. Normal cerebral volume. No white matter disease. Pituitary, pineal, and cerebellar tonsils unremarkable. No upper cervical lesions. Flow voids are maintained throughout the carotid, basilar, and vertebral arteries. There are no areas of chronic hemorrhage. Post infusion, no abnormal enhancement of the brain or meninges. Visualized calvarium, skull base, and upper cervical osseous structures unremarkable. Scalp and extracranial soft tissues, orbits, sinuses, and mastoids show no acute process. MRI CERVICAL SPINE FINDINGS There is no evidence for disc degeneration, disc herniation, vertebral body abnormality, or paraspinous mass. Normal cord size and signal throughout. Normal vertebral arteries. Post infusion, no abnormal enhancement of the intraspinal or surrounding soft tissues. IMPRESSION: Negative MRI brain and cervical spine. Electronically Signed   By: Elsie StainJohn T Curnes M.D.   On: 01/22/2016 14:46   I have personally reviewed and evaluated these images and lab results as part of my medical decision-making.   EKG Interpretation   Date/Time:  Thursday January 22 2016 10:20:07 EDT Ventricular Rate:  76 PR Interval:  135 QRS Duration: 84 QT Interval:  358 QTC Calculation: 402 R Axis:   70 Text Interpretation:  Sinus arrhythmia ST elev, probable normal early  repol pattern No previous ECGs available Confirmed by NGUYEN, EMILY  ([id number]) on 01/22/2016 10:54:31 AM      MDM  Patient was seen and evaluated immediately at bedside. Patient with left arm weakness as well as reported facial numbness. Discussed with neurology on the phone and code stroke was paged. CTA head and CTA of the head and neck were unremarkable. MRI negative for acute process. Laboratory studies with mild hypokalemia mildly elevated EtOH. Otherwise labs unremarkable. Patient expressed want to  detox from alcohol. He was provided with a prescription for Librium as well as resources in the community to help with this process. All results and plan of care were discussed at length with patient and his parents at bedside. They expressed understanding and agreement with plan of care. Patient's symptoms resolved spontaneously while in the emergency department. He was given strict return precautions in regards to his plan for detoxing from alcohol at home. Final diagnoses:  Left arm weakness  Alcoholism (HCC)    1.Left arm weakness 2. Alcoholism    Leta BaptistEmily Roe Nguyen, MD 01/24/16 [phone]

## 2016-01-22 NOTE — ED Notes (Signed)
Pt presents with LUE weakness, numbness and tingling. Pt states numbness to left side of face. Started at 0720 this am. Grips not equal. Pt admits to ETOH. Last drink this am. EDP present during triage

## 2016-01-22 NOTE — ED Notes (Addendum)
Patient transported to CT. Charge GafferAlaina RN with pt

## 2016-01-22 NOTE — ED Notes (Signed)
AMY RN CASE MANAGER TO SPEAK WITH PT AND FAMILY BEFORE DISCHARGE

## 2016-01-28 ENCOUNTER — Ambulatory Visit (INDEPENDENT_AMBULATORY_CARE_PROVIDER_SITE_OTHER): Payer: BLUE CROSS/BLUE SHIELD | Admitting: Neurology

## 2016-01-28 ENCOUNTER — Encounter: Payer: Self-pay | Admitting: Neurology

## 2016-01-28 VITALS — BP 116/72 | HR 69 | Ht 69.0 in | Wt 163.4 lb

## 2016-01-28 DIAGNOSIS — F1010 Alcohol abuse, uncomplicated: Secondary | ICD-10-CM | POA: Diagnosis not present

## 2016-01-28 DIAGNOSIS — Z720 Tobacco use: Secondary | ICD-10-CM

## 2016-01-28 DIAGNOSIS — G43909 Migraine, unspecified, not intractable, without status migrainosus: Secondary | ICD-10-CM | POA: Insufficient documentation

## 2016-01-28 DIAGNOSIS — R202 Paresthesia of skin: Secondary | ICD-10-CM | POA: Insufficient documentation

## 2016-01-28 DIAGNOSIS — G458 Other transient cerebral ischemic attacks and related syndromes: Secondary | ICD-10-CM

## 2016-01-28 DIAGNOSIS — R200 Anesthesia of skin: Secondary | ICD-10-CM | POA: Diagnosis not present

## 2016-01-28 DIAGNOSIS — G43109 Migraine with aura, not intractable, without status migrainosus: Secondary | ICD-10-CM | POA: Diagnosis not present

## 2016-01-28 NOTE — Patient Instructions (Addendum)
Remember to drink plenty of fluid, eat healthy meals and do not skip any meals. Try to eat protein with a every meal and eat a healthy snack such as fruit or nuts in between meals. Try to keep a regular sleep-wake schedule and try to exercise daily, particularly in the form of walking, 20-30 minutes a day, if you can.   As far as diagnostic testing: carotid dopplers  I would like to see you back as needed or in 3 months, sooner if we need to. Please call us with any interim questions, concerns, problems, updates or refill requests.   Our phone number is [phone]. We also have an after hours call service for urgent matters and there is a physician on-call for urgent questions. For any emergencies you know to call 911 or go to the nearest emergency room

## 2016-01-28 NOTE — Progress Notes (Signed)
[facility]    Provider:  Dr Lucia GaskinsAhern Referring Provider: No ref. provider found Primary Care Physician:  No primary care provider on file.  CC: acute left-sided    HPI:  Timothy Holland is a 25 y.o. male here as a referral from the emergency room for left sided face and arm numbness and paresthesias. He has a past medical history of ongoing alcohol abuse, chronic migraines. He is a current every day smoker. He just finished Librium, he was drinking a 12 pack and at least 6 shots a day, he has not had any alcohol. That morning he had his morning beer and he felt shooting numbness through the left arm, he got to work and he felt in in his chin and on the side of the face, then the leg started having weakness, he had abnormal sensations (points to the left and right side of the neck at the carotid), he also has migraines that started when was 8. He has been on Topamax, amitriptyline and in the past it did not help this was several years ago. The left-sided symptoms lasted until the next day, he had associated blurry vision, had a headache but unsure if it was migrainous because it may have been a hangover from the night before. Symptoms resolved. He is having a hard time focusing. He has some depression and anxiety. Migraines are on the left mostly in the back, he gets decreased vision and auras and loses 1/2 his vision, light bothers him, sound takes him. He takes tylenol with the headaches 3-4x a week. He feels pressure in the neck sometimes and his face gets red.   Reviewed notes, labs and imaging from outside physicians, which showed: Patient was seen in the emergency room on 01/22/2016 after waking up with left face and left arm numbness and paresthesias. Mother is with him. Lives alone continues to drink alcohol daily. His mother reports his alcohol abuse is mainly due to insomnia. He denied any other associated vision, speech or motor weakness in the limbs. Imaging in the emergency  room including MRI of the brain and cervical spine were negative. These were reviewed with patient and his mother. He was discharged home. He was counseled about his alcohol use.  Personally reviewed MRI of the cervical spine and MRI of the brain and agree with the following:  CMP  MRI HEAD FINDINGS  No evidence for acute infarction, hemorrhage, mass lesion, hydrocephalus, or extra-axial fluid. Normal cerebral volume. No white matter disease.  Pituitary, pineal, and cerebellar tonsils unremarkable. No upper cervical lesions.  Flow voids are maintained throughout the carotid, basilar, and vertebral arteries. There are no areas of chronic hemorrhage.  Post infusion, no abnormal enhancement of the brain or meninges. Visualized calvarium, skull base, and upper cervical osseous structures unremarkable. Scalp and extracranial soft tissues, orbits, sinuses, and mastoids show no acute process.  MRI CERVICAL SPINE FINDINGS  There is no evidence for disc degeneration, disc herniation, vertebral body abnormality, or paraspinous mass. Normal cord size and signal throughout. Normal vertebral arteries.  Post infusion, no abnormal enhancement of the intraspinal or surrounding soft tissues.  IMPRESSION: Negative MRI brain and cervical spine.  CT angiogram of the head and neck:  IMPRESSION: 1. Motion artifact at the C4 level of the neck obscuring detail of the bilateral carotid bifurcations and vertebral artery V2 segments, but negative for emergent large vessel occlusion. 2. No arterial abnormality identified. 3. Normal CT appearance of the brain.  Review of Systems: Patient complains  of symptoms per HPI as well as the following symptoms: no CP, no SOB. Pertinent negatives per HPI. All others negative.   Social History   Social History  . Marital Status: Single    Spouse Name: N/A  . Number of Children: N/A  . Years of Education: 16   Occupational History  . Not on file.    Social History Main Topics  . Smoking status: Current Every Day Smoker  . Smokeless tobacco: Not on file  . Alcohol Use: Yes     Comment: 12 pack daily and 1 pint daily  . Drug Use: Not on file  . Sexual Activity: Not on file   Other Topics Concern  . Not on file   Social History Narrative   Lives alone   Caffeine use: soda daily     Family History  Problem Relation Age of Onset  . Stroke Neg Hx     Past Medical History  Diagnosis Date  . History of ETOH abuse     Past Surgical History  Procedure Laterality Date  . Hernia repair      Current Outpatient Prescriptions  Medication Sig Dispense Refill  . acetaminophen (TYLENOL) 500 MG tablet Take 3,000 mg by mouth daily as needed (migraine).     No current facility-administered medications for this visit.    Allergies as of 01/28/2016  . (No Known Allergies)    Vitals: BP 116/72 mmHg  Pulse 69  Ht  (1.753 m)  Wt 163 lb 6.4 oz (74.118 kg)  BMI 24.12 kg/m2 Last Weight:  Wt Readings from Last 1 Encounters:  01/28/16 163 lb 6.4 oz (74.118 kg)   Last Height:   Ht Readings from Last 1 Encounters:  01/28/16  (1.753 m)   Physical exam: Exam: Gen: NAD, conversant, well nourised, well groomed                     CV: RRR, no MRG. No Carotid Bruits. No peripheral edema, warm, nontender Eyes: Conjunctivae clear without exudates or hemorrhage  Neuro: Detailed Neurologic Exam  Speech:    Speech is normal; fluent and spontaneous with normal comprehension.  Cognition:    The patient is oriented to person, place, and time;     recent and remote memory intact;     language fluent;     normal attention, concentration,     fund of knowledge Cranial Nerves:    The pupils are equal, round, and reactive to light. The fundi are normal and spontaneous venous pulsations are present. Visual fields are full to finger confrontation. Extraocular movements are intact. Trigeminal sensation is intact and the muscles  of mastication are normal. The face is symmetric. The palate elevates in the midline. Hearing intact. Voice is normal. Shoulder shrug is normal. The tongue has normal motion without fasciculations.   Coordination:    Normal finger to nose and heel to shin. Normal rapid alternating movements.   Gait:    Heel-toe and tandem gait are normal.   Motor Observation:    No asymmetry, no atrophy, and no involuntary movements noted. Tone:    Normal muscle tone.    Posture:    Posture is normal. normal erect    Strength:    Strength is V/V in the upper and lower limbs.      Sensation: intact to LT     Reflex Exam:  DTR's:    Deep tendon reflexes in the upper and lower extremities are normal  bilaterally.   Toes:    The toes are downgoing bilaterally.   Clonus:    Clonus is absent.       Assessment/Plan:   25 y.o. male here as a referral from the emergency room for left sided face and arm numbness and paresthesias. He has a past medical history of ongoing alcohol abuse, chronic migraines. He is a current every day smoker.   -Had a long discussion with patient regarding his alcohol abuse, the risks for withdrawal, delirium tremors, risk of death in withdrawal, he has not drank for 4 days and he has been on Librium, I advised him if he starts having symptoms of withdrawal to proceed directly to the emergency room. Recommended Alcoholics Anonymous or n inpatient/outpatient rehab program, did discuss that without reaching out for help his risk of rebound to alcohol is significant.  -Patient declines any management for migraines, insomnia, or mood disorder at this point. I do recommend follow-up with primary care as soon as possible.  - Also discussed his tobacco abuse and the risks of chronic smoking  - Patient reports neck discomfort, facial fullness, he points to the areas of the carotid arteries. The carotid arteries are not fully visualized on CTA of the neck, we'll order carotid  Dopplers just to ensure that there is no dissection or other issues.  - he can f/u in 3 months with me  Naomie Dean, MD  [facility] [address] [place], [place] [address]  Phone [phone] Fax [phone]

## 2016-02-06 ENCOUNTER — Ambulatory Visit (HOSPITAL_COMMUNITY)
Admission: RE | Admit: 2016-02-06 | Discharge: 2016-02-06 | Disposition: A | Discharge status: Home / Self Care | Payer: BLUE CROSS/BLUE SHIELD | Source: Ambulatory Visit | Attending: Neurology | Admitting: Neurology

## 2016-02-06 DIAGNOSIS — G458 Other transient cerebral ischemic attacks and related syndromes: Secondary | ICD-10-CM | POA: Diagnosis present

## 2016-02-06 NOTE — Progress Notes (Signed)
*  PRELIMINARY RESULTS* Vascular Ultrasound Carotid Duplex (Doppler) has been completed.   There is no obvious evidence of hemodynamically significant internal carotid artery stenosis bilaterally. Vertebral arteries are patent with antegrade flow.  02/06/2016 9:51 AM Gertie FeyMichelle Maressa Apollo, RVT, RDCS, RDMS

## 2016-02-10 ENCOUNTER — Telehealth: Payer: Self-pay | Admitting: *Deleted

## 2016-02-10 NOTE — Telephone Encounter (Signed)
-----   Message from Anson FretAntonia B Ahern, MD sent at 02/08/2016  9:05 PM EDT ----- Carotid ultrasound normal thanks. There is no obvious evidence of hemodynamically signficant internal carotid artery stenosis bilaterally. Vertebral arteries are patent with antegrade flow.There is no obvious evidence of hemodynamically signficant internal carotid artery stenosis bilaterally. Vertebral arteries are patent with antegrade flow.

## 2016-02-10 NOTE — Telephone Encounter (Signed)
Called and spoke to pt about results per Dr Ahern note. Pt verbalized understanding.  

## 2016-03-24 ENCOUNTER — Telehealth: Payer: Self-pay | Admitting: Neurology

## 2016-03-24 NOTE — Telephone Encounter (Signed)
He cal come in and see a Freight forwarder thanks

## 2016-03-24 NOTE — Telephone Encounter (Signed)
Dr Lucia Gaskins- please advise Looks like at last OV in 01/2016 he declined migraine management.

## 2016-03-24 NOTE — Telephone Encounter (Signed)
Pt called in complaining about migraines with stroke like symptoms. He called to make appt but Dr. Lucia Gaskins does not have anything available to him. He can only come in at 4 due to work. Is there a way to work him in? Please call and advise [phone], after 330p .

## 2016-03-24 NOTE — Telephone Encounter (Signed)
Called and LVM for pt. Advised per Dr Lucia Gaskins, we can get him an appt with one of the NP. Asked him to call back and schedule. Gave [facility] phone number.  Please schedule with NP if he calls

## 2017-06-23 ENCOUNTER — Other Ambulatory Visit: Payer: Self-pay | Admitting: Occupational Medicine

## 2017-06-23 ENCOUNTER — Ambulatory Visit: Payer: Self-pay

## 2017-06-23 DIAGNOSIS — M545 Low back pain, unspecified: Secondary | ICD-10-CM

## 2018-06-01 IMAGING — CT CT ANGIO NECK
1 of 10 series · 5 of 33 positions shown · IV contrast (ISOVUE 370)
Comparison: None.

ADDENDUM:
Study discussed by telephone with Dr. AUNTYJATTY DELOWR on 01/22/2016 At
0048 hours.
CLINICAL DATA: 25-year-old male code stroke, presenting with left
arm numbness and weakness since 4304 hours. Initial encounter.

EXAM:
CT ANGIOGRAPHY HEAD AND NECK
TECHNIQUE: Multidetector CT imaging of the head and neck was performed using
the standard protocol during bolus administration of intravenous
contrast. Multiplanar CT image reconstructions and MIPs were
obtained to evaluate the vascular anatomy. Carotid stenosis
measurements (when applicable) are obtained utilizing NASCET
criteria, using the distal internal carotid diameter as the
denominator.
CONTRAST:  100 mL Isovue 370

[Series 8: axial thin · axial · 0.42mm/px · z∈[-398,-150]mm · 5 of 375 slices shown]
[im 63/375  soft-tissue]
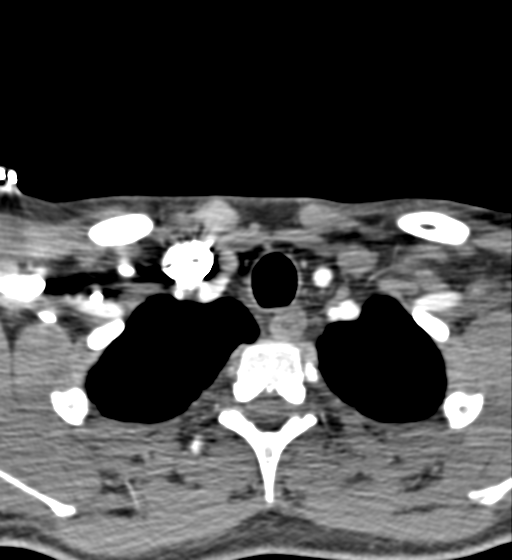
[im 125/375  bone]
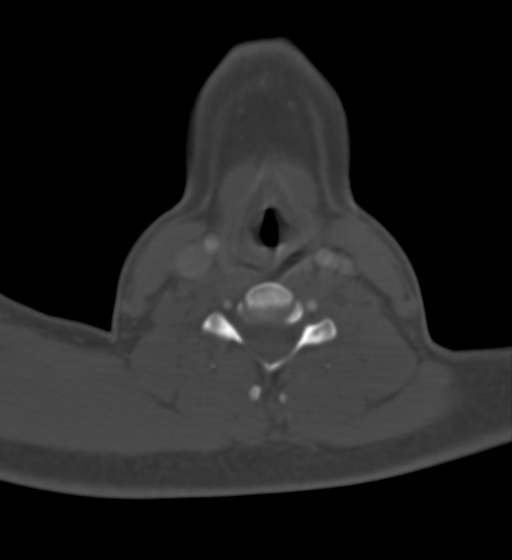
[im 188/375  soft-tissue]
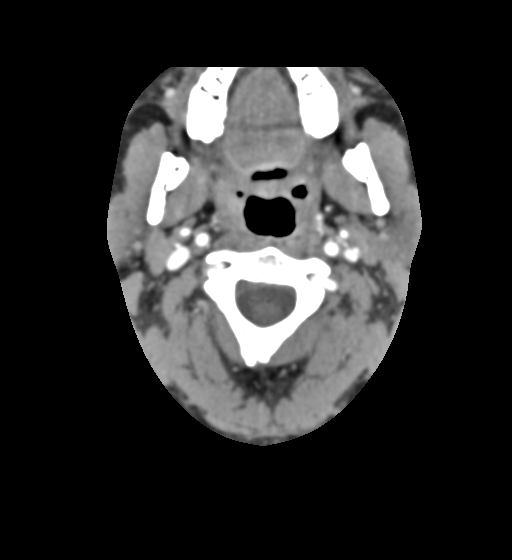
[im 250/375  bone]
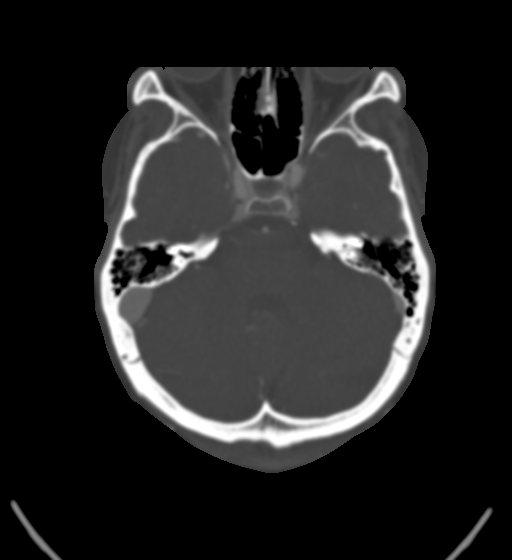
[im 312/375  soft-tissue]
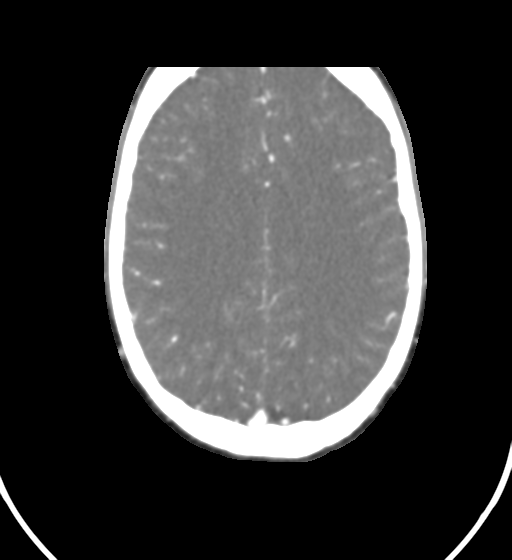

[5 of 33 positions shown; findings below may reference images not displayed]

FINDINGS: CT HEAD

Brain: Cerebral volume is normal. No midline shift, mass effect, or
evidence of intracranial mass lesion. No acute intracranial
hemorrhage identified. *INSERT* infarcts; subtle hypodensity at the
left superior peri-Rolandic cortex seen on series 15, image 26 is
felt to be partial volume artifact. Normal gray-white matter
differentiation elsewhere.

Calvarium and skull base: Negative.

Paranasal sinuses: Clear.

Orbits: Negative.

CTA NECK

Skeleton: Motion artifact through the C4 level of the neck affecting
the mandible and cervical spine detail. Mild scoliotic curvature of
the upper thoracic spine. No acute osseous abnormality identified.

Minimal left maxillary sinus mucosal thickening.

Other neck: Negative lung apices. No superior mediastinal
lymphadenopathy. Negative thyroid, larynx, pharynx (motion
artifact), parapharyngeal spaces, retropharyngeal space, sublingual
space, submandibular glands (motion artifact) and parotid glands. No
cervical lymphadenopathy.

Aortic arch: 3 vessel arch configuration. Normal arch and great
vessel origins.

Right carotid system: Mild obscuration of the right CCA origin due
to dense right subclavian contrast artifact. Negative right CCA
otherwise. Motion artifact degrades detail of the right carotid
bifurcation which is patent. No cervical right ICA abnormality
identified, mild tortuosity.

Left carotid system: Negative left CCA. Motion artifact obscures
detail of the left carotid bifurcation which is patent. Negative
cervical left ICA otherwise.

Vertebral arteries:

No proximal subclavian artery stenosis. Normal vertebral artery
origins. Codominant vertebral artery is are obscured in the V2
segment at C4 due to motion, but otherwise appear normal to the
skullbase. By the level of the skullbase the left vertebral artery
is dominant.

CTA HEAD

Posterior circulation: No distal vertebral artery stenosis, the left
is dominant. Both PICA origins are normal. Normal vertebrobasilar
junction and basilar artery. Normal SCA and left PCA origins. Fetal
type right PCA origin. Left posterior communicating artery is
present. Bilateral PCA branches are within normal limits.

Anterior circulation: Both ICA siphons are normal. Normal ophthalmic
and posterior communicating artery origins.

Normal left ICA terminus. The right is hypoplastic owing to
diminutive or absent right ACA A1 segment. Normal left ACA and MCA
origins.

Anterior communicating artery may be fenestrated but otherwise is
normal. bilateral ACA branches are normal. Left MCA M1 segment,
bifurcation, and left MCA branches are normal. Right MCA M1 segment,
bifurcation, and right MCA branches are normal.

Venous sinuses: Patent.

Anatomic variants: Diminutive or absent right ACA A1 segment.
Fenestrated anterior communicating artery. Mildly dominant distal
left vertebral artery.

Delayed phase: No abnormal intracranial enhancement. There may be a
small developmental venous anomaly in the right parietal lobe
(series 17, image 22).
IMPRESSION: 1. Motion artifact at the C4 level of the neck obscuring detail of
the bilateral carotid bifurcations and vertebral artery V2 segments,
but negative for emergent large vessel occlusion.
2. No arterial abnormality identified.
3.  Normal CT appearance of the brain.

## 2019-05-25 ENCOUNTER — Other Ambulatory Visit: Payer: Self-pay

## 2019-05-25 DIAGNOSIS — Z20822 Contact with and (suspected) exposure to covid-19: Secondary | ICD-10-CM

## 2019-05-26 LAB — NOVEL CORONAVIRUS, NAA: SARS-CoV-2, NAA: NOT DETECTED

## 2019-08-08 ENCOUNTER — Other Ambulatory Visit: Payer: Self-pay

## 2019-08-08 DIAGNOSIS — Z20822 Contact with and (suspected) exposure to covid-19: Secondary | ICD-10-CM

## 2019-08-09 LAB — NOVEL CORONAVIRUS, NAA: SARS-CoV-2, NAA: NOT DETECTED

## 2019-08-10 ENCOUNTER — Other Ambulatory Visit: Payer: Self-pay

## 2019-09-13 ENCOUNTER — Ambulatory Visit: Payer: Managed Care, Other (non HMO) | Attending: Internal Medicine

## 2019-09-13 DIAGNOSIS — Z20822 Contact with and (suspected) exposure to covid-19: Secondary | ICD-10-CM

## 2019-09-14 LAB — NOVEL CORONAVIRUS, NAA: SARS-CoV-2, NAA: NOT DETECTED

## 2019-09-21 ENCOUNTER — Ambulatory Visit: Payer: Managed Care, Other (non HMO)
